# Patient Record
Sex: Male | Born: 1980 | Race: White | Hispanic: No | Marital: Married | Smoking: Current every day smoker
Health system: Southern US, Community
[De-identification: ages and names within clinical notes are randomized; demographics above are authoritative.]

## PROBLEM LIST (undated history)

## (undated) DIAGNOSIS — F411 Generalized anxiety disorder: Secondary | ICD-10-CM

## (undated) DIAGNOSIS — J40 Bronchitis, not specified as acute or chronic: Secondary | ICD-10-CM

## (undated) DIAGNOSIS — G250 Essential tremor: Secondary | ICD-10-CM

## (undated) HISTORY — DX: Generalized anxiety disorder: F41.1

## (undated) HISTORY — DX: Essential tremor: G25.0

---

## 2002-08-01 ENCOUNTER — Emergency Department (HOSPITAL_COMMUNITY): Admission: EM | Admit: 2002-08-01 | Discharge: 2002-08-01 | Payer: Self-pay | Admitting: Emergency Medicine

## 2002-08-01 ENCOUNTER — Encounter: Payer: Self-pay | Admitting: Oral Surgery

## 2002-08-26 ENCOUNTER — Emergency Department (HOSPITAL_COMMUNITY): Admission: EM | Admit: 2002-08-26 | Discharge: 2002-08-26 | Payer: Self-pay | Admitting: Emergency Medicine

## 2002-08-26 ENCOUNTER — Encounter: Payer: Self-pay | Admitting: Emergency Medicine

## 2010-12-12 ENCOUNTER — Emergency Department (HOSPITAL_COMMUNITY)
Admission: EM | Admit: 2010-12-12 | Discharge: 2010-12-12 | Disposition: A | Discharge status: Home / Self Care | Payer: Self-pay | Attending: Emergency Medicine | Admitting: Emergency Medicine

## 2010-12-12 DIAGNOSIS — B86 Scabies: Secondary | ICD-10-CM | POA: Insufficient documentation

## 2010-12-12 DIAGNOSIS — R21 Rash and other nonspecific skin eruption: Secondary | ICD-10-CM | POA: Insufficient documentation

## 2011-08-23 ENCOUNTER — Encounter (HOSPITAL_COMMUNITY): Payer: Self-pay | Admitting: *Deleted

## 2011-08-23 ENCOUNTER — Emergency Department (HOSPITAL_COMMUNITY): Payer: PRIVATE HEALTH INSURANCE

## 2011-08-23 ENCOUNTER — Emergency Department (HOSPITAL_COMMUNITY)
Admission: EM | Admit: 2011-08-23 | Discharge: 2011-08-23 | Disposition: A | Discharge status: Home / Self Care | Payer: PRIVATE HEALTH INSURANCE | Attending: Emergency Medicine | Admitting: Emergency Medicine

## 2011-08-23 DIAGNOSIS — M549 Dorsalgia, unspecified: Secondary | ICD-10-CM | POA: Insufficient documentation

## 2011-08-23 DIAGNOSIS — R071 Chest pain on breathing: Secondary | ICD-10-CM | POA: Insufficient documentation

## 2011-08-23 DIAGNOSIS — R0789 Other chest pain: Secondary | ICD-10-CM

## 2011-08-23 DIAGNOSIS — R0602 Shortness of breath: Secondary | ICD-10-CM | POA: Insufficient documentation

## 2011-08-23 DIAGNOSIS — F17200 Nicotine dependence, unspecified, uncomplicated: Secondary | ICD-10-CM | POA: Insufficient documentation

## 2011-08-23 LAB — POCT I-STAT, CHEM 8
BUN: 15 mg/dL (ref 6–23)
Creatinine, Ser: 1.1 mg/dL (ref 0.50–1.35)
Hemoglobin: 17.7 g/dL — ABNORMAL HIGH (ref 13.0–17.0)
Potassium: 3.9 mEq/L (ref 3.5–5.1)
Sodium: 141 mEq/L (ref 135–145)

## 2011-08-23 LAB — CBC
Hemoglobin: 17.5 g/dL — ABNORMAL HIGH (ref 13.0–17.0)
Platelets: 259 10*3/uL (ref 150–400)
RBC: 5.57 MIL/uL (ref 4.22–5.81)

## 2011-08-23 LAB — POCT I-STAT TROPONIN I: Troponin i, poc: 0 ng/mL (ref 0.00–0.08)

## 2011-08-23 MED ORDER — OXYCODONE-ACETAMINOPHEN 5-325 MG PO TABS: 1.0000 | ORAL_TABLET | ORAL | Status: AC | PRN

## 2011-08-23 MED ORDER — CYCLOBENZAPRINE HCL 10 MG PO TABS: 10.0000 mg | ORAL_TABLET | Freq: Two times a day (BID) | ORAL | Status: AC | PRN

## 2011-08-23 MED ORDER — KETOROLAC TROMETHAMINE 30 MG/ML IJ SOLN
30.0000 mg | Freq: Once | INTRAMUSCULAR | Status: AC
  Administered 2011-08-23: 30 mg via INTRAVENOUS
  Filled 2011-08-23: qty 1

## 2011-08-23 MED ORDER — IOHEXOL 300 MG/ML  SOLN
100.0000 mL | Freq: Once | INTRAMUSCULAR | Status: AC | PRN
  Administered 2011-08-23: 80 mL via INTRAVENOUS

## 2011-08-23 MED ORDER — MORPHINE SULFATE 4 MG/ML IJ SOLN
4.0000 mg | Freq: Once | INTRAMUSCULAR | Status: AC
  Administered 2011-08-23: 4 mg via INTRAVENOUS
  Filled 2011-08-23: qty 1

## 2011-08-23 MED ORDER — OXYCODONE-ACETAMINOPHEN 5-325 MG PO TABS: 2.0000 | ORAL_TABLET | ORAL | Status: DC | PRN

## 2011-08-23 NOTE — ED Provider Notes (Signed)
History     CSN: [id number]  Arrival date & time 08/23/11  1703   First MD Initiated Contact with Patient 08/23/11 1935      Chief Complaint  Patient presents with  . Shortness of Breath   previously healthy 31 year old male presents with persisting and worsening left posterior lung and rib pain. He states he was seen at the local walk-in clinic approximately 2 weeks ago and was initially diagnosed with "bronchitis and pneumonia and "he was placed on a Zithromax. Patient initially felt was getting better but then began having fairly severe left posterior rib pain and pain with deep inspiration. At that point in time. He was again seen at the [facility] clinic and was diagnosed with pleurisy. Place, and was placed on indomethacin. He has been on indomethacin for approximately 7 days. He now feels increasing pain in his left posterior back and lung, especially with deep inspiration. Denies any fevers. Denies any anterior chest pain. He has mild shortness of breath. No other complaints at this time. He currently smokes a half pack per day. According to the chart  (Consider location/radiation/quality/duration/timing/severity/associated sxs/prior treatment) HPI  History reviewed. No pertinent past medical history.  History reviewed. No pertinent past surgical history.  No family history on file.  History  Substance Use Topics  . Smoking status: Current Everyday Smoker -- 0.5 packs/day  . Smokeless tobacco: Not on file  . Alcohol Use: No      Review of Systems  All other systems reviewed and are negative.    Allergies  Review of patient's allergies indicates no known allergies.  Home Medications   Current Outpatient Rx  Name Route Sig Dispense Refill  . INDOMETHACIN 25 MG PO CAPS Oral Take 25 mg by mouth 3 (three) times daily with meals. Take one to two tablet by mouth.      BP 130/79  Pulse 87  Temp(Src) 97.8 F (36.6 C) (Oral)  Resp 19  Wt 170 lb (77.111 kg)  SpO2  99%  Physical Exam  Nursing note and vitals reviewed. Constitutional: He is oriented to person, place, and time. He appears well-developed and well-nourished.  HENT:  Head: Normocephalic and atraumatic.  Eyes: Conjunctivae and EOM are normal. Pupils are equal, round, and reactive to light.  Neck: Neck supple.  Cardiovascular: Normal rate and regular rhythm.  Exam reveals no gallop and no friction rub.   No murmur heard. Pulmonary/Chest: Breath sounds normal. No respiratory distress. He has no wheezes. He has no rales. He exhibits no tenderness.  Abdominal: Soft. Bowel sounds are normal. He exhibits no distension. There is no tenderness. There is no rebound and no guarding.  Musculoskeletal: Normal range of motion.  Neurological: He is alert and oriented to person, place, and time. No cranial nerve deficit. Coordination normal.  Skin: Skin is warm and dry. No rash noted.  Psychiatric: He has a normal mood and affect.    ED Course  Procedures (including critical care time)   Labs Reviewed  I-STAT TROPONIN I  I-STAT, CHEM 8  CBC   Dg Chest 2 View  08/23/2011  *RADIOLOGY REPORT*  Clinical Data: Shortness of breath.  CHEST - 2 VIEW  Comparison: Plain films of the chest 08/11/2011.  Findings: The lungs are clear.  No pneumothorax or pleural effusion.  Heart size is normal.  No focal bony abnormality.  IMPRESSION: Negative chest.  Original Report Authenticated By: Bernadene Bell. Maricela Curet, M.D.     No diagnosis found.    MDM  Pt is seen and examined;  Initial history and physical completed.  Will follow.      Results for orders placed during the hospital encounter of 08/23/11  CBC      Component Value Range   WBC 10.8 (*) 4.0 - 10.5 (K/uL)   RBC 5.57  4.22 - 5.81 (MIL/uL)   Hemoglobin 17.5 (*) 13.0 - 17.0 (g/dL)   HCT 16.1  09.6 - 04.5 (%)   MCV 88.0  78.0 - 100.0 (fL)   MCH 31.4  26.0 - 34.0 (pg)   MCHC 35.7  30.0 - 36.0 (g/dL)   RDW 40.9  81.1 - 91.4 (%)   Platelets 259   150 - 400 (K/uL)  POCT I-STAT, CHEM 8      Component Value Range   Sodium 141  135 - 145 (mEq/L)   Potassium 3.9  3.5 - 5.1 (mEq/L)   Chloride 105  96 - 112 (mEq/L)   BUN 15  6 - 23 (mg/dL)   Creatinine, Ser 7.82  0.50 - 1.35 (mg/dL)   Glucose, Bld 90  70 - 99 (mg/dL)   Calcium, Ion 9.56  2.13 - 1.32 (mmol/L)   TCO2 26  0 - 100 (mmol/L)   Hemoglobin 17.7 (*) 13.0 - 17.0 (g/dL)   HCT 08.6  57.8 - 46.9 (%)  POCT I-STAT TROPONIN I      Component Value Range   Troponin i, poc 0.00  0.00 - 0.08 (ng/mL)   Comment 3            Dg Chest 2 View  08/23/2011  *RADIOLOGY REPORT*  Clinical Data: Shortness of breath.  CHEST - 2 VIEW  Comparison: Plain films of the chest 08/11/2011.  Findings: The lungs are clear.  No pneumothorax or pleural effusion.  Heart size is normal.  No focal bony abnormality.  IMPRESSION: Negative chest.  Original Report Authenticated By: Bernadene Bell. Maricela Curet, M.D.   Ct Angio Chest W/cm &/or Wo Cm  08/23/2011  *RADIOLOGY REPORT*  Clinical Data:  Chest pain and shortness of breath.  CT ANGIOGRAPHY CHEST WITH CONTRAST  Technique:  Multidetector CT imaging of the chest was performed using the standard protocol during bolus administration of intravenous contrast.  Multiplanar CT image reconstructions including MIPs were obtained to evaluate the vascular anatomy.  Contrast: 80mL OMNIPAQUE IOHEXOL 300 MG/ML IV SOLN  Comparison:  Two-view chest 08/22/2010.  Findings:  Pulmonary arterial opacification is excellent.  No focal filling defects are present to suggest pulmonary emboli.  The heart size is normal.  No significant pleural or pericardial effusion is present.  There is no significant mediastinal or axillary adenopathy.  Minimal soft tissue in the breasts bilaterally suggest gynecomastia.  The lungs are clear.  No focal nodule, mass, or airspace disease is present.  Bone windows are unremarkable.  Review of the MIP images confirms the above findings.  IMPRESSION:  1.  No evidence for  pulmonary embolus. 2.  No acute or focal abnormality to explain the patient's symptoms. 3.  Minimal soft tissue within the breasts bilaterally suggesting gynecomastia.  Original Report Authenticated By: Jamesetta Orleans. MATTERN, M.D.      9:32 PM  Patient feels much better. Lab studies and CAT scan was normal. Suspect strain of the intercostal cartilages based on his clinical presentation. He'll be stable for discharge home on a course of pain medication and muscle relaxers and will be instructed to followup with his primary Dr. this week.  Tatiana Courter A. Patrica Duel, MD 08/23/11 2133

## 2011-08-23 NOTE — ED Notes (Signed)
Pt states "was seen @ [facility], given endomethacin, the pain has been going on for about a wk, but today I was up on an eave & felt a click in my back, that's when it hurt"; pt indicates left mid back pain.

## 2011-08-23 NOTE — ED Notes (Signed)
Pt reports being diagnosed with PNA approximately a month ago. Followed up with PCP a couple of weeks ago and was told that he had pleurisy. Pt continues to have chest wall pain and still has a strong cough. Pt concerned that he may have a broken rib.

## 2012-04-27 ENCOUNTER — Emergency Department (HOSPITAL_COMMUNITY)
Admission: EM | Admit: 2012-04-27 | Discharge: 2012-04-27 | Disposition: A | Discharge status: Home / Self Care | Payer: PRIVATE HEALTH INSURANCE | Attending: Emergency Medicine | Admitting: Emergency Medicine

## 2012-04-27 ENCOUNTER — Emergency Department (HOSPITAL_COMMUNITY): Payer: PRIVATE HEALTH INSURANCE

## 2012-04-27 ENCOUNTER — Encounter (HOSPITAL_COMMUNITY): Payer: Self-pay | Admitting: *Deleted

## 2012-04-27 DIAGNOSIS — F17200 Nicotine dependence, unspecified, uncomplicated: Secondary | ICD-10-CM | POA: Insufficient documentation

## 2012-04-27 DIAGNOSIS — K122 Cellulitis and abscess of mouth: Secondary | ICD-10-CM | POA: Insufficient documentation

## 2012-04-27 HISTORY — DX: Bronchitis, not specified as acute or chronic: J40

## 2012-04-27 LAB — CBC WITH DIFFERENTIAL/PLATELET
Basophils Absolute: 0 K/uL (ref 0.0–0.1)
Basophils Relative: 0 % (ref 0–1)
Eosinophils Absolute: 0.2 K/uL (ref 0.0–0.7)
Eosinophils Relative: 2 % (ref 0–5)
HCT: 43.7 % (ref 39.0–52.0)
Hemoglobin: 15.2 g/dL (ref 13.0–17.0)
Lymphocytes Relative: 28 % (ref 12–46)
Lymphs Abs: 2 K/uL (ref 0.7–4.0)
MCH: 30.8 pg (ref 26.0–34.0)
MCHC: 34.8 g/dL (ref 30.0–36.0)
MCV: 88.5 fL (ref 78.0–100.0)
Monocytes Absolute: 0.7 K/uL (ref 0.1–1.0)
Monocytes Relative: 10 % (ref 3–12)
Neutro Abs: 4.2 K/uL (ref 1.7–7.7)
Neutrophils Relative %: 59 % (ref 43–77)
Platelets: 179 K/uL (ref 150–400)
RBC: 4.94 MIL/uL (ref 4.22–5.81)
RDW: 12.5 % (ref 11.5–15.5)
WBC: 7.1 K/uL (ref 4.0–10.5)

## 2012-04-27 LAB — BASIC METABOLIC PANEL WITH GFR
BUN: 9 mg/dL (ref 6–23)
CO2: 28 meq/L (ref 19–32)
Calcium: 9.4 mg/dL (ref 8.4–10.5)
Chloride: 104 meq/L (ref 96–112)
Creatinine, Ser: 1.02 mg/dL (ref 0.50–1.35)
GFR calc Af Amer: >90 mL/min
GFR calc non Af Amer: >90 mL/min
Glucose, Bld: 85 mg/dL (ref 70–99)
Potassium: 4.2 meq/L (ref 3.5–5.1)
Sodium: 139 meq/L (ref 135–145)

## 2012-04-27 LAB — RAPID STREP SCREEN (MED CTR MEBANE ONLY): Streptococcus, Group A Screen (Direct): NEGATIVE

## 2012-04-27 LAB — MONONUCLEOSIS SCREEN: Mono Screen: NEGATIVE

## 2012-04-27 MED ORDER — PREDNISONE 10 MG PO TABS
40.0000 mg | ORAL_TABLET | Freq: Every day | ORAL | Status: AC
Start: 2012-04-27 — End: ?

## 2012-04-27 MED ORDER — AMOXICILLIN 500 MG PO CAPS
500.0000 mg | ORAL_CAPSULE | Freq: Once | ORAL | Status: AC
  Administered 2012-04-27: 500 mg via ORAL
  Filled 2012-04-27: qty 1

## 2012-04-27 MED ORDER — DEXAMETHASONE SODIUM PHOSPHATE 10 MG/ML IJ SOLN
10.0000 mg | Freq: Once | INTRAMUSCULAR | Status: AC
  Administered 2012-04-27: 10 mg via INTRAVENOUS
  Filled 2012-04-27: qty 1

## 2012-04-27 MED ORDER — MORPHINE SULFATE 4 MG/ML IJ SOLN
4.0000 mg | Freq: Once | INTRAMUSCULAR | Status: AC
  Administered 2012-04-27: 4 mg via INTRAVENOUS
  Filled 2012-04-27: qty 1

## 2012-04-27 MED ORDER — DIPHENHYDRAMINE HCL 50 MG/ML IJ SOLN
25.0000 mg | Freq: Once | INTRAMUSCULAR | Status: AC
  Administered 2012-04-27: 08:00:00 via INTRAVENOUS

## 2012-04-27 MED ORDER — AMOXICILLIN 500 MG PO CAPS: 500.0000 mg | ORAL_CAPSULE | Freq: Three times a day (TID) | ORAL | Status: AC

## 2012-04-27 MED ORDER — FAMOTIDINE IN NACL 20-0.9 MG/50ML-% IV SOLN
20.0000 mg | Freq: Once | INTRAVENOUS | Status: AC
  Administered 2012-04-27: 20 mg via INTRAVENOUS
  Filled 2012-04-27: qty 50

## 2012-04-27 MED ORDER — IOHEXOL 300 MG/ML  SOLN
75.0000 mL | Freq: Once | INTRAMUSCULAR | Status: AC | PRN
  Administered 2012-04-27: 75 mL via INTRAVENOUS

## 2012-04-27 NOTE — Progress Notes (Signed)
31 yo man woke up this morning with swelling of his uvula.  He was all right yesterday.  There was no fever.  He has no history of allergic reactions.  Exam shows redness and swelling of the uvula.  CBC, chemistries, rapid strep, mono spot all negative.  Soft tissue neck and CT neck showed patent airway, uvular swelling.  Rx for uvular infection with amoxicillin 500 mg tid x 5 days.  He can take Chloraseptic spray or Cepacol lozenges for symptomatic relief.

## 2012-04-27 NOTE — ED Notes (Signed)
The pt  Woke up with am with a sorethroat.  He felt ok yesterday.  His throat hurts to swallow

## 2012-04-27 NOTE — ED Notes (Signed)
Pt woke this morning with difficulty breathing, RA sats 97%, swollen uvula and tonsils.  Productive cough.

## 2012-04-27 NOTE — ED Provider Notes (Signed)
History     CSN: [id number]  Arrival date & time 04/27/12  4098   First MD Initiated Contact with Patient 04/27/12 [phone]      Chief Complaint  Patient presents with  . Shortness of Breath    (Consider location/radiation/quality/duration/timing/severity/associated sxs/prior treatment) HPI Comments: Mark Hoover is a 31 y.o. Male who presents with complaint of throat swelling. States his wife woke him up this morning because he was snoring. States when he woke up, he noted that his throat is very sore, and it felt like it was swollen. Pt states difficulty and pain with swallowing, voice change. Denies recent URI symptoms. Denies fever. Does admit to cough but states it has been going on for "months."  Denies any new products or medications. No swelling of lips, no rash. No other complaints. NO medications taken prior to the arrival.    Past Medical History  Diagnosis Date  . Bronchitis     History reviewed. No pertinent past surgical history.  History reviewed. No pertinent family history.  History  Substance Use Topics  . Smoking status: Current Every Day Smoker -- 0.5 packs/day    Types: Cigarettes  . Smokeless tobacco: Not on file  . Alcohol Use: Yes      Review of Systems  Constitutional: Negative for fever and chills.  HENT: Positive for sore throat, trouble swallowing and voice change. Negative for congestion, facial swelling, drooling, neck pain, neck stiffness and postnasal drip.   Respiratory: Positive for cough and shortness of breath.   Cardiovascular: Negative.   Gastrointestinal: Negative.   Musculoskeletal: Negative.   Skin: Negative for rash.  Neurological: Negative for dizziness, weakness and headaches.  Hematological: Negative for adenopathy.    Allergies  Review of patient's allergies indicates no known allergies.  Home Medications  No current outpatient prescriptions on file.  BP 135/88  Pulse 69  Temp 97.7 F (36.5 C) (Oral)  Resp 20  SpO2  97%  Physical Exam  Nursing note and vitals reviewed. Constitutional: He is oriented to person, place, and time. He appears well-developed and well-nourished. No distress.  HENT:  Head: Normocephalic and atraumatic.  Right Ear: Tympanic membrane, external ear and ear canal normal.  Left Ear: Tympanic membrane, external ear and ear canal normal.  Nose: Nose normal.  Mouth/Throat: Mucous membranes are normal. Uvula swelling present. Posterior oropharyngeal edema and posterior oropharyngeal erythema present. No oropharyngeal exudate.  Eyes: Conjunctivae normal are normal.  Neck: Normal range of motion. Neck supple.  Cardiovascular: Normal rate, regular rhythm and normal heart sounds.   Pulmonary/Chest: Effort normal and breath sounds normal. No respiratory distress. He has no wheezes. He has no rales.  Musculoskeletal: He exhibits no edema.  Lymphadenopathy:    He has no cervical adenopathy.  Neurological: He is alert and oriented to person, place, and time.  Skin: Skin is warm and dry.  Psychiatric: He has a normal mood and affect.    ED Course  Procedures (including critical care time)  Pt with swelling of uvula and pharynx, painful. Suspect infectious vs allergic. Will get labs, steroids, benadryl ordered, pain meds ordered.   Results for orders placed during the hospital encounter of 04/27/12  RAPID STREP SCREEN      Component Value Range   Streptococcus, Group A Screen (Direct) NEGATIVE  NEGATIVE  CBC WITH DIFFERENTIAL      Component Value Range   WBC 7.1  4.0 - 10.5 K/uL   RBC 4.94  4.22 - 5.81 MIL/uL   Hemoglobin  15.2  13.0 - 17.0 g/dL   HCT 96.2  95.2 - 84.1 %   MCV 88.5  78.0 - 100.0 fL   MCH 30.8  26.0 - 34.0 pg   MCHC 34.8  30.0 - 36.0 g/dL   RDW 32.4  40.1 - 02.7 %   Platelets 179  150 - 400 K/uL   Neutrophils Relative 59  43 - 77 %   Neutro Abs 4.2  1.7 - 7.7 K/uL   Lymphocytes Relative 28  12 - 46 %   Lymphs Abs 2.0  0.7 - 4.0 K/uL   Monocytes Relative 10  3  - 12 %   Monocytes Absolute 0.7  0.1 - 1.0 K/uL   Eosinophils Relative 2  0 - 5 %   Eosinophils Absolute 0.2  0.0 - 0.7 K/uL   Basophils Relative 0  0 - 1 %   Basophils Absolute 0.0  0.0 - 0.1 K/uL  BASIC METABOLIC PANEL      Component Value Range   Sodium 139  135 - 145 mEq/L   Potassium 4.2  3.5 - 5.1 mEq/L   Chloride 104  96 - 112 mEq/L   CO2 28  19 - 32 mEq/L   Glucose, Bld 85  70 - 99 mg/dL   BUN 9  6 - 23 mg/dL   Creatinine, Ser 2.53  0.50 - 1.35 mg/dL   Calcium 9.4  8.4 - 66.4 mg/dL   GFR calc non Af Amer >90  >90 mL/min   GFR calc Af Amer >90  >90 mL/min  MONONUCLEOSIS SCREEN      Component Value Range   Mono Screen NEGATIVE  NEGATIVE   Dg Neck Soft Tissue  04/27/2012  *RADIOLOGY REPORT*  Clinical Data: Sore throat.  Short of breath.  NECK SOFT TISSUES - 1+ VIEW  Comparison: None.  Findings: The nasopharynx and oropharynx are patent.  Upper trachea is widely patent without narrowing.  Epiglottis and aryepiglottic folds are unremarkable.  Minimal hypertrophy of the palatine tonsils.  IMPRESSION: Patent airway.  Minimal hypertrophy of the palatine tonsils.  CT may be helpful.   Original Report Authenticated By: Donavan Burnet, M.D.    Ct Soft Tissue Neck W Contrast  04/27/2012  *RADIOLOGY REPORT*  Clinical Data: 31 year old male with throat swelling, shortness of breath.  CT NECK WITH CONTRAST  Technique:  Multidetector CT imaging of the neck was performed with intravenous contrast.  Contrast: 75mL OMNIPAQUE IOHEXOL 300 MG/ML  SOLN  Comparison: lateral neck radiograph 0734 hours the same day.  Findings: Lung apices are clear.  Negative visualized superior mediastinum.  Negative thyroid and larynx.  Retropharyngeal space, parapharyngeal spaces, sublingual space, submandibular glands and parotid glands are within normal limits.  Bilateral cervical lymph nodes are within normal limits for age.  Mild adenoid hypertrophy is within normal limits and the posterior nasal cavity and  nasopharynx are normal.  The remainder of the nasal cavity is normal.  The hard palate is normal.  The oral cavity is normal.  Bilateral tonsillar pillar hypertrophy.  There is prominence of the soft palate in the region of the uvula which appears to be in part related to retained low density secretions. This does mildly narrow the airway at the level of the uvula.  The tonsillar hypertrophy is symmetric and abate inferiorly. There is mild lingual tonsil hypertrophy.  Trace retained secretions suspected in the vallecula.  Normal epiglottis and hypopharynx.  Major vascular structures in the neck are patent and normal. Visualized  orbit soft tissues are within normal limits.  Negative visualized brain parenchyma. Visualized paranasal sinuses and mastoids are clear.  No acute osseous abnormality identified.  IMPRESSION: 1.  There is soft tissue prominence of the soft palate including at the uvula, at least a portion of which appears to be low density and might relate to retained secretions. Other soft tissue enlargement/angio-edema type process is possible.  Recommend direct visualization if symptoms persist. Bilateral tonsillar pillar hypertrophy also affecting the oropharynx, but is symmetric and very likely physiologic. 2.  Otherwise normal nasopharynx, nasal cavity, oral cavity, hypopharynx and larynx. 3.    Otherwise normal neck CT.   Original Report Authenticated By: Harley Hallmark, M.D.     10:52 AM Labs normal. Uvula swelling improved slightly. Pain continues. Airway intact, swelling seems to subside slightly.  Discussed with Dr. Ignacia Palma, most likely infectious since very painful, will treat with amoxicillin for uvulitis, follow up with pcp. Return if worsening.    1. Uvulitis       MDM  Pt with painful swelling of the uvula and pharynx. VS normal. Labs unremarkable. Airway intact. CT showed edema, with patent airway, no abscess, no masses. Suspect infectious in etiology since very painful.  Antibiotics, steroids, home, return if worsening.         Lottie Mussel, PA 04/27/12 1058

## 2012-04-27 NOTE — ED Provider Notes (Signed)
Medical screening examination/treatment/procedure(s) were conducted as a shared visit with non-physician practitioner(s) and myself.  I personally evaluated the patient during the encounter 31 yo man woke up this morning with swelling of his uvula. He was all right yesterday. There was no fever. He has no history of allergic reactions. Exam shows redness and swelling of the uvula. CBC, chemistries, rapid strep, mono spot all negative. Soft tissue neck and CT neck showed patent airway, uvular swelling. Rx for uvular infection with amoxicillin 500 mg tid x 5 days. He can take Chloraseptic spray or Cepacol lozenges for symptomatic relief.       Carleene Cooper III, MD 04/27/12 [phone]

## 2012-12-13 ENCOUNTER — Ambulatory Visit (INDEPENDENT_AMBULATORY_CARE_PROVIDER_SITE_OTHER): Payer: PRIVATE HEALTH INSURANCE | Admitting: Neurology

## 2012-12-13 ENCOUNTER — Encounter: Payer: Self-pay | Admitting: Neurology

## 2012-12-13 VITALS — BP 115/81 | HR 77 | Temp 98.0°F | Ht 70.0 in | Wt 188.0 lb

## 2012-12-13 DIAGNOSIS — G250 Essential tremor: Secondary | ICD-10-CM

## 2012-12-13 DIAGNOSIS — F411 Generalized anxiety disorder: Secondary | ICD-10-CM | POA: Insufficient documentation

## 2012-12-13 HISTORY — DX: Essential tremor: G25.0

## 2012-12-13 HISTORY — DX: Generalized anxiety disorder: F41.1
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
## 2012-12-13 NOTE — Patient Instructions (Signed)
I think we should see how well you do on the Lamictal for reduction of anxiety and it may positively affect your tremor. Your exam otherwise is normal. Let's check again in 2 months.

## 2013-02-12 ENCOUNTER — Ambulatory Visit: Payer: PRIVATE HEALTH INSURANCE | Admitting: Neurology

## 2013-10-16 ENCOUNTER — Emergency Department (HOSPITAL_COMMUNITY)
Admission: EM | Admit: 2013-10-16 | Discharge: 2013-10-16 | Disposition: A | Discharge status: Home / Self Care | Payer: PRIVATE HEALTH INSURANCE | Source: Home / Self Care | Attending: Emergency Medicine | Admitting: Emergency Medicine

## 2013-10-16 ENCOUNTER — Encounter (HOSPITAL_COMMUNITY): Payer: Self-pay | Admitting: Emergency Medicine

## 2013-10-16 DIAGNOSIS — J329 Chronic sinusitis, unspecified: Secondary | ICD-10-CM

## 2013-10-16 MED ORDER — MINOCYCLINE HCL 100 MG PO CAPS: 100.0000 mg | ORAL_CAPSULE | Freq: Two times a day (BID) | ORAL | Status: AC

## 2013-10-16 NOTE — ED Notes (Signed)
C/o dark green nasal drainage x 1 1/2 weeks, cough, body aches, chills and sweats, and weakness. He tried cold and flu medicine without relief.

## 2013-10-16 NOTE — ED Provider Notes (Signed)
Medical screening examination/treatment/procedure(s) were performed by non-physician practitioner and as supervising physician I was immediately available for consultation/collaboration.  Sybella Harnish, M.D.  Amelia Burgard C Donnie Gedeon, MD 10/16/13 2208 

## 2013-10-16 NOTE — ED Provider Notes (Signed)
CSN: [id number]     Arrival date & time 10/16/13  1815 History   First MD Initiated Contact with Patient 10/16/13 1926     Chief Complaint  Patient presents with  . URI   (Consider location/radiation/quality/duration/timing/severity/associated sxs/prior Treatment) HPI Comments: 33 year old male presents for evaluation of 10 days of cough, nasal congestion, sore throat, body aches, subjective fever. All the symptoms have been getting gradually worse over the past 10 days. He has been taking over-the-counter medications without relief of his symptoms. He feels lightheaded when coughing only. No measured fever at home. No recent travel or sick contacts.  Patient is a 33 y.o. male presenting with URI.  URI Presenting symptoms: congestion, cough, fatigue, fever, rhinorrhea and sore throat   Presenting symptoms: no ear pain   Associated symptoms: headaches   Associated symptoms: no arthralgias, no myalgias and no neck pain     Past Medical History  Diagnosis Date  . Bronchitis   . Essential tremor 12/13/2012  . Anxiety state, unspecified 12/13/2012   History reviewed. No pertinent past surgical history. Family History  Problem Relation Age of Onset  . Adopted: Yes   History  Substance Use Topics  . Smoking status: Current Every Day Smoker -- 0.50 packs/day    Types: Cigarettes  . Smokeless tobacco: Not on file  . Alcohol Use: No    Review of Systems  Constitutional: Positive for fever, chills and fatigue.  HENT: Positive for congestion, postnasal drip, rhinorrhea, sinus pressure and sore throat. Negative for ear discharge and ear pain.   Eyes: Negative for visual disturbance.  Respiratory: Positive for cough. Negative for chest tightness and shortness of breath.   Cardiovascular: Negative for chest pain, palpitations and leg swelling.  Gastrointestinal: Negative for nausea, vomiting, abdominal pain, diarrhea and constipation.  Genitourinary: Negative for dysuria, urgency, frequency  and hematuria.  Musculoskeletal: Negative for arthralgias, myalgias, neck pain and neck stiffness.  Skin: Negative for rash.  Neurological: Positive for light-headedness and headaches. Negative for dizziness and weakness.    Allergies  Lexapro  Home Medications   Current Outpatient Rx  Name  Route  Sig  Dispense  Refill  . ALPRAZolam (XANAX) 0.5 MG tablet   Oral   Take 0.5 mg by mouth every morning.         Marland Kitchen NEXIUM 40 MG capsule      every morning.          Marland Kitchen QUEtiapine (SEROQUEL) 100 MG tablet   Oral   Take 100 mg by mouth at bedtime.         . ALPRAZolam (XANAX XR) 2 MG 24 hr tablet               . LamoTRIgine 50 MG TB24               . minocycline (MINOCIN) 100 MG capsule   Oral   Take 1 capsule (100 mg total) by mouth 2 (two) times daily.   20 capsule   0   . predniSONE (DELTASONE) 10 MG tablet   Oral   Take 4 tablets (40 mg total) by mouth daily.   16 tablet   0    BP 124/88  Pulse 74  Temp(Src) 98.6 F (37 C) (Oral)  Resp 16  SpO2 100% Physical Exam  Nursing note and vitals reviewed. Constitutional: He is oriented to person, place, and time. He appears well-developed and well-nourished. No distress.  HENT:  Head: Normocephalic and atraumatic.  Nose: Right sinus  exhibits maxillary sinus tenderness and frontal sinus tenderness. Left sinus exhibits maxillary sinus tenderness and frontal sinus tenderness.  Mouth/Throat: Uvula is midline and mucous membranes are normal. Posterior oropharyngeal erythema present. No oropharyngeal exudate.  Neck: Normal range of motion. Neck supple.  Cardiovascular: Normal rate, regular rhythm and normal heart sounds.  Exam reveals no gallop and no friction rub.   No murmur heard. Pulmonary/Chest: Effort normal and breath sounds normal. No respiratory distress. He has no wheezes. He has no rales.  Lymphadenopathy:    He has cervical adenopathy (tonsillar, superficial cervical).  Neurological: He is alert and  oriented to person, place, and time. Coordination normal.  Skin: Skin is warm and dry. No rash noted. He is not diaphoretic.  Psychiatric: He has a normal mood and affect. Judgment normal.    ED Course  Procedures (including critical care time) Labs Review Labs Reviewed - No data to display Imaging Review No results found.   MDM   1. Sinusitis    Probable sinusitis. Will treat with minocycline because this will also treat uncomplicated pneumonia or atypical pneumonia. Followup when necessary if not improving. Advised to take Advil allergy sinus as needed for sinus pressure and drainage, and Delsym as needed for cough.   New Prescriptions   MINOCYCLINE (MINOCIN) 100 MG CAPSULE    Take 1 capsule (100 mg total) by mouth 2 (two) times daily.       Mark GoodZachary H Jaramie Bastos, PA-C 10/16/13 2002

## 2013-10-16 NOTE — Discharge Instructions (Signed)

## 2015-10-07 ENCOUNTER — Emergency Department (HOSPITAL_COMMUNITY)
Admission: EM | Admit: 2015-10-07 | Discharge: 2015-10-07 | Disposition: A | Discharge status: Home / Self Care | Payer: PRIVATE HEALTH INSURANCE

## 2017-10-01 ENCOUNTER — Encounter: Payer: Self-pay | Admitting: Intensive Care

## 2017-10-01 ENCOUNTER — Emergency Department
Admission: EM | Admit: 2017-10-01 | Discharge: 2017-10-01 | Disposition: A | Discharge status: Home / Self Care | Payer: PRIVATE HEALTH INSURANCE | Attending: Emergency Medicine | Admitting: Emergency Medicine

## 2017-10-01 DIAGNOSIS — Z79899 Other long term (current) drug therapy: Secondary | ICD-10-CM | POA: Insufficient documentation

## 2017-10-01 DIAGNOSIS — F17210 Nicotine dependence, cigarettes, uncomplicated: Secondary | ICD-10-CM | POA: Insufficient documentation

## 2017-10-01 DIAGNOSIS — K529 Noninfective gastroenteritis and colitis, unspecified: Secondary | ICD-10-CM

## 2017-10-01 LAB — COMPREHENSIVE METABOLIC PANEL
ALK PHOS: 59 U/L (ref 38–126)
ALT: 56 U/L (ref 17–63)
ANION GAP: 14 (ref 5–15)
AST: 48 U/L — ABNORMAL HIGH (ref 15–41)
Albumin: 6 g/dL — ABNORMAL HIGH (ref 3.5–5.0)
BILIRUBIN TOTAL: 0.5 mg/dL (ref 0.3–1.2)
BUN: 20 mg/dL (ref 6–20)
CALCIUM: 10.2 mg/dL (ref 8.9–10.3)
CO2: 19 mmol/L — ABNORMAL LOW (ref 22–32)
Chloride: 104 mmol/L (ref 101–111)
Creatinine, Ser: 1.16 mg/dL (ref 0.61–1.24)
Glucose, Bld: 123 mg/dL — ABNORMAL HIGH (ref 65–99)
Potassium: 4.8 mmol/L (ref 3.5–5.1)
Sodium: 137 mmol/L (ref 135–145)
TOTAL PROTEIN: 10.4 g/dL — AB (ref 6.5–8.1)

## 2017-10-01 LAB — CBC
HCT: 53 % — ABNORMAL HIGH (ref 40.0–52.0)
Hemoglobin: 18.2 g/dL — ABNORMAL HIGH (ref 13.0–18.0)
MCH: 30 pg (ref 26.0–34.0)
MCHC: 34.3 g/dL (ref 32.0–36.0)
MCV: 87.4 fL (ref 80.0–100.0)
Platelets: 387 10*3/uL (ref 150–440)
RBC: 6.07 MIL/uL — AB (ref 4.40–5.90)
RDW: 13.5 % (ref 11.5–14.5)
WBC: 22.8 10*3/uL — AB (ref 3.8–10.6)

## 2017-10-01 LAB — LIPASE, BLOOD: Lipase: 61 U/L — ABNORMAL HIGH (ref 11–51)

## 2017-10-01 MED ORDER — SODIUM CHLORIDE 0.9 % IV SOLN
1000.0000 mL | Freq: Once | INTRAVENOUS | Status: AC
  Administered 2017-10-01: 1000 mL via INTRAVENOUS

## 2017-10-01 MED ORDER — ONDANSETRON 4 MG PO TBDP: 4.0000 mg | ORAL_TABLET | Freq: Three times a day (TID) | ORAL | 0 refills | Status: AC | PRN

## 2017-10-01 NOTE — ED Notes (Signed)
FIRST NURSE NOTE:  [place]  Pt states n/v/d since last night. IV in place.

## 2017-10-01 NOTE — ED Provider Notes (Signed)
[facility] Emergency Department Provider Note   ____________________________________________    I have reviewed the triage vital signs and the nursing notes.   HISTORY  Chief Complaint Emesis and Diarrhea     HPI Rodriguez Aguinaldo is a 37 y.o. male who presents with nausea vomiting and diarrhea.  Patient reports this morning he developed nausea and started vomiting multiple times.  He felt very weak so he called EMS.  He had 1-2 episodes of diarrhea.  No sick contacts reported.  He is not take anything for this.  He denies abdominal pain, but occasional cramps prior to vomiting.  No fevers or chills reported.  No recent travel or camping.   Past Medical History:  Diagnosis Date  . Anxiety state, unspecified 12/13/2012  . Bronchitis   . Essential tremor 12/13/2012    Patient Active Problem List   Diagnosis Date Noted  . Essential tremor 12/13/2012  . Anxiety state, unspecified 12/13/2012    History reviewed. No pertinent surgical history.  Prior to Admission medications   Medication Sig Start Date End Date Taking? Authorizing Provider  ALPRAZolam (XANAX XR) 2 MG 24 hr tablet  11/15/12   [provider]  ALPRAZolam Prudy Feeler) 0.5 MG tablet Take 0.5 mg by mouth every morning.    [provider]  LamoTRIgine 50 MG TB24  11/21/12   [provider]  minocycline (MINOCIN) 100 MG capsule Take 1 capsule (100 mg total) by mouth 2 (two) times daily. 10/16/13   Baker, Zachary H, PA-C  NEXIUM 40 MG capsule every morning.  11/20/12   [provider]  ondansetron (ZOFRAN ODT) 4 MG disintegrating tablet Take 1 tablet (4 mg total) by mouth every 8 (eight) hours as needed for nausea or vomiting. 10/01/17   Jene Every, MD  predniSONE (DELTASONE) 10 MG tablet Take 4 tablets (40 mg total) by mouth daily. 04/27/12   Kirichenko, Tatyana, PA-C  QUEtiapine (SEROQUEL) 100 MG tablet Take 100 mg by mouth at bedtime.    [provider]      Allergies Lexapro [escitalopram oxalate]  Family History  Adopted: Yes    Social History Social History   Tobacco Use  . Smoking status: Current Every Day Smoker    Packs/day: 0.50    Types: Cigarettes  . Smokeless tobacco: Never Used  Substance Use Topics  . Alcohol use: No    Frequency: Never  . Drug use: No    Review of Systems  Constitutional: No fever/chills Eyes: No visual changes.  ENT: No neck pain Cardiovascular: Denies palpitations Respiratory: Denies shortness of breath. Gastrointestinal: As above Genitourinary: Negative for dysuria. Musculoskeletal: Negative for back pain. Skin: Negative for rash. Neurological: Negative for headaches    ____________________________________________   PHYSICAL EXAM:  VITAL SIGNS: ED Triage Vitals  Enc Vitals Group     BP 10/01/17 1128 (!) 135/117     Pulse Rate 10/01/17 1128 82     Resp 10/01/17 1128 18     Temp 10/01/17 1128 97.6 F (36.4 C)     Temp Source 10/01/17 1128 Oral     SpO2 10/01/17 1128 98 %     Weight 10/01/17 1129 68 kg (150 lb)     Height 10/01/17 1129 1.778 m (5\' 10" )     Head Circumference --      Peak Flow --      Pain Score --      Pain Loc --      Pain Edu? --  Excl. in GC? --     Constitutional: Alert and oriented. No acute distress. Pleasant and interactive Eyes: Conjunctivae are normal.   Nose: No congestion/rhinnorhea. Mouth/Throat: Mucous membranes are moist.    Cardiovascular: Normal rate, regular rhythm.   Good peripheral circulation. Respiratory: Normal respiratory effort.  No retractions. Gastrointestinal: Soft and nontender. No distention.  No CVA tenderness. Genitourinary: deferred Musculoskeletal:   Warm and well perfused Neurologic:  Normal speech and language. No gross focal neurologic deficits are appreciated.  Skin:  Skin is warm, dry and intact. No rash noted. Psychiatric: Mood and affect are normal. Speech and behavior are  normal.  ____________________________________________   LABS (all labs ordered are listed, but only abnormal results are displayed)  Labs Reviewed  LIPASE, BLOOD - Abnormal; Notable for the following components:      Result Value   Lipase 61 (*)    All other components within normal limits  COMPREHENSIVE METABOLIC PANEL - Abnormal; Notable for the following components:   CO2 19 (*)    Glucose, Bld 123 (*)    Total Protein 10.4 (*)    Albumin 6.0 (*)    AST 48 (*)    All other components within normal limits  CBC - Abnormal; Notable for the following components:   WBC 22.8 (*)    RBC 6.07 (*)    Hemoglobin 18.2 (*)    HCT 53.0 (*)    All other components within normal limits   ____________________________________________  EKG  None ____________________________________________  RADIOLOGY  None ____________________________________________   PROCEDURES  Procedure(s) performed: No  Procedures   Critical Care performed: No ____________________________________________   INITIAL IMPRESSION / ASSESSMENT AND PLAN / ED COURSE  Pertinent labs & imaging results that were available during my care of the patient were reviewed by me and considered in my medical decision making (see chart for details).  Patient feeling somewhat better in the emergency department after nausea medication per EMS.  Abdominal exam is reassuring.  Elevated white blood cell count likely related to gastroenteritis.  No epigastric tenderness to suggest pancreatitis.  Patient will be treated with IV fluids and IV Zofran  Patient felt significant better after IV fluids and Zofran.  No further vomiting.  Will treat as an outpatient with p.o. Zofran, outpatient follow-up as needed.  Return precautions discussed    ____________________________________________   FINAL CLINICAL IMPRESSION(S) / ED DIAGNOSES  Final diagnoses:  Gastroenteritis        Note:  This document was prepared using  Dragon voice recognition software and may include unintentional dictation errors.    Jene EveryKinner, Nichola Cieslinski, MD 10/01/17 [phone]

## 2017-10-01 NOTE — ED Notes (Signed)
Paper copy of discharge signature in paper chart. Signature pad broken. Pt able to ambulate independently and in NAD at this time.

## 2017-10-01 NOTE — ED Triage Notes (Signed)
Patient reports N/V that started this AM around 0100. C/o diarrhea since arriving to ER with 1 episode. Arrived by EMS and they administered nausea medicine in route. A&O x4

## 2019-04-15 ENCOUNTER — Inpatient Hospital Stay (HOSPITAL_COMMUNITY): Payer: Medicaid Other

## 2019-04-15 ENCOUNTER — Emergency Department (HOSPITAL_COMMUNITY): Payer: Medicaid Other

## 2019-04-15 ENCOUNTER — Inpatient Hospital Stay (HOSPITAL_COMMUNITY)
Admission: EM | Admit: 2019-04-15 | Discharge: 2019-05-15 | DRG: 082 | Disposition: E | Payer: Medicaid Other | Attending: General Surgery | Admitting: General Surgery

## 2019-04-15 DIAGNOSIS — R402212 Coma scale, best verbal response, none, at arrival to emergency department: Secondary | ICD-10-CM | POA: Diagnosis present

## 2019-04-15 DIAGNOSIS — Z23 Encounter for immunization: Secondary | ICD-10-CM | POA: Diagnosis not present

## 2019-04-15 DIAGNOSIS — X749XXA Intentional self-harm by unspecified firearm discharge, initial encounter: Secondary | ICD-10-CM | POA: Diagnosis not present

## 2019-04-15 DIAGNOSIS — S0219XA Other fracture of base of skull, initial encounter for closed fracture: Secondary | ICD-10-CM | POA: Diagnosis present

## 2019-04-15 DIAGNOSIS — R402112 Coma scale, eyes open, never, at arrival to emergency department: Secondary | ICD-10-CM | POA: Diagnosis present

## 2019-04-15 DIAGNOSIS — S066X9A Traumatic subarachnoid hemorrhage with loss of consciousness of unspecified duration, initial encounter: Secondary | ICD-10-CM | POA: Diagnosis present

## 2019-04-15 DIAGNOSIS — J9601 Acute respiratory failure with hypoxia: Secondary | ICD-10-CM | POA: Diagnosis present

## 2019-04-15 DIAGNOSIS — S0231XB Fracture of orbital floor, right side, initial encounter for open fracture: Secondary | ICD-10-CM | POA: Diagnosis present

## 2019-04-15 DIAGNOSIS — S0193XA Puncture wound without foreign body of unspecified part of head, initial encounter: Secondary | ICD-10-CM | POA: Diagnosis present

## 2019-04-15 DIAGNOSIS — S0240DA Maxillary fracture, left side, initial encounter for closed fracture: Secondary | ICD-10-CM | POA: Diagnosis present

## 2019-04-15 DIAGNOSIS — S0240CA Maxillary fracture, right side, initial encounter for closed fracture: Secondary | ICD-10-CM | POA: Diagnosis present

## 2019-04-15 DIAGNOSIS — R402312 Coma scale, best motor response, none, at arrival to emergency department: Secondary | ICD-10-CM | POA: Diagnosis present

## 2019-04-15 DIAGNOSIS — S0232XB Fracture of orbital floor, left side, initial encounter for open fracture: Secondary | ICD-10-CM | POA: Diagnosis present

## 2019-04-15 DIAGNOSIS — Z20828 Contact with and (suspected) exposure to other viral communicable diseases: Secondary | ICD-10-CM | POA: Diagnosis present

## 2019-04-15 DIAGNOSIS — I959 Hypotension, unspecified: Secondary | ICD-10-CM | POA: Diagnosis present

## 2019-04-15 DIAGNOSIS — W3400XA Accidental discharge from unspecified firearms or gun, initial encounter: Secondary | ICD-10-CM

## 2019-04-15 DIAGNOSIS — Z452 Encounter for adjustment and management of vascular access device: Secondary | ICD-10-CM

## 2019-04-15 LAB — SARS CORONAVIRUS 2 BY RT PCR (HOSPITAL ORDER, PERFORMED IN ~~LOC~~ HOSPITAL LAB): SARS Coronavirus 2: NEGATIVE

## 2019-04-15 LAB — CBC
HCT: 47.3 % (ref 39.0–52.0)
Hemoglobin: 14.4 g/dL (ref 13.0–17.0)
MCH: 29.9 pg (ref 26.0–34.0)
MCHC: 30.4 g/dL (ref 30.0–36.0)
MCV: 98.3 fL (ref 80.0–100.0)
Platelets: 173 10*3/uL (ref 150–400)
RBC: 4.81 MIL/uL (ref 4.22–5.81)
RDW: 12 % (ref 11.5–15.5)
WBC: 6.6 10*3/uL (ref 4.0–10.5)
nRBC: 0 % (ref 0.0–0.2)

## 2019-04-15 LAB — I-STAT CHEM 8, ED
BUN: 13 mg/dL (ref 6–20)
Calcium, Ion: 0.9 mmol/L — ABNORMAL LOW (ref 1.15–1.40)
Chloride: 114 mmol/L — ABNORMAL HIGH (ref 98–111)
Creatinine, Ser: 1 mg/dL (ref 0.61–1.24)
Glucose, Bld: 372 mg/dL — ABNORMAL HIGH (ref 70–99)
HCT: 42 % (ref 39.0–52.0)
Hemoglobin: 14.3 g/dL (ref 13.0–17.0)
Potassium: 7.5 mmol/L (ref 3.5–5.1)
Sodium: 141 mmol/L (ref 135–145)
TCO2: 12 mmol/L — ABNORMAL LOW (ref 22–32)

## 2019-04-15 LAB — COMPREHENSIVE METABOLIC PANEL
ALT: 219 U/L — ABNORMAL HIGH (ref 0–44)
AST: 189 U/L — ABNORMAL HIGH (ref 15–41)
Albumin: 3.2 g/dL — ABNORMAL LOW (ref 3.5–5.0)
Alkaline Phosphatase: 38 U/L (ref 38–126)
Anion gap: 22 — ABNORMAL HIGH (ref 5–15)
BUN: 12 mg/dL (ref 6–20)
CO2: 10 mmol/L — ABNORMAL LOW (ref 22–32)
Calcium: 8.3 mg/dL — ABNORMAL LOW (ref 8.9–10.3)
Chloride: 110 mmol/L (ref 98–111)
Creatinine, Ser: 1.27 mg/dL — ABNORMAL HIGH (ref 0.61–1.24)
GFR calc Af Amer: >60 mL/min (ref >60)
GFR calc non Af Amer: >60 mL/min (ref >60)
Glucose, Bld: 343 mg/dL — ABNORMAL HIGH (ref 70–99)
Potassium: 7.3 mmol/L (ref 3.5–5.1)
Sodium: 142 mmol/L (ref 135–145)
Total Bilirubin: 0.9 mg/dL (ref 0.3–1.2)
Total Protein: 5.3 g/dL — ABNORMAL LOW (ref 6.5–8.1)

## 2019-04-15 LAB — PROTIME-INR
INR: 1.4 — ABNORMAL HIGH (ref 0.8–1.2)
Prothrombin Time: 17.1 seconds — ABNORMAL HIGH (ref 11.4–15.2)

## 2019-04-15 LAB — CDS SEROLOGY

## 2019-04-15 LAB — ETHANOL: Alcohol, Ethyl (B): <10 mg/dL (ref <10)

## 2019-04-15 LAB — ABO/RH: ABO/RH(D): AB POS

## 2019-04-15 LAB — PREPARE RBC (CROSSMATCH)

## 2019-04-15 LAB — LACTIC ACID, PLASMA: Lactic Acid, Venous: >11 mmol/L (ref 0.5–1.9)

## 2019-04-15 MED ORDER — NOREPINEPHRINE 16 MG/250ML-% IV SOLN
0.0000 ug/min | INTRAVENOUS | Status: DC
  Filled 2019-04-15: qty 250

## 2019-04-15 MED ORDER — FENTANYL BOLUS VIA INFUSION
50.0000 ug | INTRAVENOUS | Status: DC | PRN
  Filled 2019-04-15: qty 50

## 2019-04-15 MED ORDER — SODIUM CHLORIDE 0.9% IV SOLUTION: Freq: Once | INTRAVENOUS | Status: DC

## 2019-04-15 MED ORDER — SODIUM CHLORIDE 0.9 % IV BOLUS: 1000.0000 mL | Freq: Once | INTRAVENOUS | Status: DC

## 2019-04-15 MED ORDER — HYDRALAZINE HCL 20 MG/ML IJ SOLN: 10.0000 mg | INTRAMUSCULAR | Status: DC | PRN

## 2019-04-15 MED ORDER — PANTOPRAZOLE SODIUM 40 MG IV SOLR: 40.0000 mg | Freq: Every day | INTRAVENOUS | Status: DC

## 2019-04-15 MED ORDER — SODIUM CHLORIDE 0.9 % IV SOLN: INTRAVENOUS | Status: DC

## 2019-04-15 MED ORDER — PHENYLEPHRINE HCL-NACL 10-0.9 MG/250ML-% IV SOLN
0.0000 ug/min | INTRAVENOUS | Status: DC
  Administered 2019-04-15: 17:00:00 20 ug/min via INTRAVENOUS
  Administered 2019-04-15: 13.333 ug/min via INTRAVENOUS
  Filled 2019-04-15: qty 500
  Filled 2019-04-15: qty 250
  Filled 2019-04-15: qty 500

## 2019-04-15 MED ORDER — ATROPINE SULFATE 1 MG/ML IJ SOLN
INTRAMUSCULAR | Status: AC | PRN
  Administered 2019-04-15: 1 mg via INTRAVENOUS

## 2019-04-15 MED ORDER — CALCIUM GLUCONATE-NACL 2-0.675 GM/100ML-% IV SOLN
2.0000 g | Freq: Once | INTRAVENOUS | Status: AC
  Administered 2019-04-15: 17:00:00 2000 mg via INTRAVENOUS
  Filled 2019-04-15: qty 100

## 2019-04-15 MED ORDER — SODIUM BICARBONATE 8.4 % IV SOLN
50.0000 meq | Freq: Once | INTRAVENOUS | Status: AC
  Administered 2019-04-15: 50 meq via INTRAVENOUS

## 2019-04-15 MED ORDER — SODIUM CHLORIDE 0.9% IV SOLUTION
Freq: Once | INTRAVENOUS | Status: AC
  Administered 2019-04-15: 17:00:00 via INTRAVENOUS

## 2019-04-15 MED ORDER — SODIUM CHLORIDE 0.9 % IV SOLN
INTRAVENOUS | Status: AC | PRN
  Administered 2019-04-15: 999 mL/h via INTRAVENOUS

## 2019-04-15 MED ORDER — CHLORHEXIDINE GLUCONATE CLOTH 2 % EX PADS: 6.0000 | MEDICATED_PAD | Freq: Every day | CUTANEOUS | Status: DC

## 2019-04-15 MED ORDER — PHENYLEPHRINE HCL-NACL 40-0.9 MG/250ML-% IV SOLN
0.0000 ug/min | INTRAVENOUS | Status: DC
  Administered 2019-04-15: 20:00:00 400 ug/min via INTRAVENOUS
  Filled 2019-04-15 (×5): qty 250

## 2019-04-15 MED ORDER — TETANUS-DIPHTH-ACELL PERTUSSIS 5-2.5-18.5 LF-MCG/0.5 IM SUSP
0.5000 mL | Freq: Once | INTRAMUSCULAR | Status: AC
  Administered 2019-04-15: 0.5 mL via INTRAMUSCULAR

## 2019-04-15 MED ORDER — EPINEPHRINE 1 MG/10ML IJ SOSY
PREFILLED_SYRINGE | INTRAMUSCULAR | Status: AC | PRN
  Administered 2019-04-15: 1 via INTRAVENOUS

## 2019-04-15 MED ORDER — PROPOFOL 1000 MG/100ML IV EMUL
INTRAVENOUS | Status: AC
  Filled 2019-04-15: qty 100

## 2019-04-15 MED ORDER — PROPOFOL 1000 MG/100ML IV EMUL: 5.0000 ug/kg/min | INTRAVENOUS | Status: DC

## 2019-04-15 MED ORDER — ONDANSETRON 4 MG PO TBDP: 4.0000 mg | ORAL_TABLET | Freq: Four times a day (QID) | ORAL | Status: DC | PRN

## 2019-04-15 MED ORDER — BISACODYL 10 MG RE SUPP: 10.0000 mg | Freq: Every day | RECTAL | Status: DC | PRN

## 2019-04-15 MED ORDER — FENTANYL 2500MCG IN NS 250ML (10MCG/ML) PREMIX INFUSION: 50.0000 ug/h | INTRAVENOUS | Status: DC

## 2019-04-15 MED ORDER — FENTANYL CITRATE (PF) 100 MCG/2ML IJ SOLN: 50.0000 ug | Freq: Once | INTRAMUSCULAR | Status: DC

## 2019-04-15 MED ORDER — MIDAZOLAM HCL 2 MG/2ML IJ SOLN: 2.0000 mg | INTRAMUSCULAR | Status: DC | PRN

## 2019-04-15 MED ORDER — IOHEXOL 350 MG/ML SOLN
75.0000 mL | Freq: Once | INTRAVENOUS | Status: AC | PRN
  Administered 2019-04-15: 75 mL via INTRAVENOUS

## 2019-04-15 MED ORDER — NOREPINEPHRINE 4 MG/250ML-% IV SOLN: 0.0000 ug/min | INTRAVENOUS | Status: DC

## 2019-04-15 MED ORDER — PANTOPRAZOLE SODIUM 40 MG PO TBEC: 40.0000 mg | DELAYED_RELEASE_TABLET | Freq: Every day | ORAL | Status: DC

## 2019-04-15 MED ORDER — VASOPRESSIN 20 UNIT/ML IV SOLN
0.0300 [IU]/min | INTRAVENOUS | Status: DC
  Administered 2019-04-15: 0.03 [IU]/min via INTRAVENOUS
  Filled 2019-04-15: qty 2

## 2019-04-15 MED ORDER — PROPOFOL 1000 MG/100ML IV EMUL: 0.0000 ug/kg/min | INTRAVENOUS | Status: DC

## 2019-04-15 MED ORDER — NOREPINEPHRINE 4 MG/250ML-% IV SOLN
INTRAVENOUS | Status: AC
  Filled 2019-04-15: qty 250

## 2019-04-15 MED ORDER — ONDANSETRON HCL 4 MG/2ML IJ SOLN: 4.0000 mg | Freq: Four times a day (QID) | INTRAMUSCULAR | Status: DC | PRN

## 2019-04-15 MED ORDER — SODIUM BICARBONATE 8.4 % IV SOLN
INTRAVENOUS | Status: AC
  Filled 2019-04-15: qty 50

## 2019-04-16 LAB — BPAM RBC
Blood Product Expiration Date: 202009162359
Blood Product Expiration Date: 202009162359
Blood Product Expiration Date: 202009232359
Blood Product Expiration Date: 202009232359
Blood Product Expiration Date: 202009232359
Blood Product Expiration Date: 202009232359
Blood Product Expiration Date: 202009232359
Blood Product Expiration Date: 202009242359
Blood Product Expiration Date: 202010062359
Blood Product Expiration Date: 202010062359
ISSUE DATE / TIME: 202009011500
ISSUE DATE / TIME: 202009011500
ISSUE DATE / TIME: 202009011638
ISSUE DATE / TIME: 202009011658
ISSUE DATE / TIME: 202009011658
ISSUE DATE / TIME: 202009011742
ISSUE DATE / TIME: 202009011853
ISSUE DATE / TIME: 202009011853
ISSUE DATE / TIME: 202009011914
ISSUE DATE / TIME: 202009011914
Unit Type and Rh: 5100
Unit Type and Rh: 5100
Unit Type and Rh: 6200
Unit Type and Rh: 6200
Unit Type and Rh: 6200
Unit Type and Rh: 6200
Unit Type and Rh: 6200
Unit Type and Rh: 6200
Unit Type and Rh: 8400
Unit Type and Rh: 8400

## 2019-04-16 LAB — PREPARE FRESH FROZEN PLASMA
Unit division: 0
Unit division: 0
Unit division: 0

## 2019-04-16 LAB — TYPE AND SCREEN
ABO/RH(D): AB POS
Antibody Screen: NEGATIVE
Unit division: 0
Unit division: 0
Unit division: 0
Unit division: 0
Unit division: 0
Unit division: 0
Unit division: 0
Unit division: 0
Unit division: 0
Unit division: 0

## 2019-04-16 LAB — BPAM FFP
Blood Product Expiration Date: 202009172359
Blood Product Expiration Date: 202009172359
Blood Product Expiration Date: 202009062359
Blood Product Expiration Date: 202009062359
Blood Product Expiration Date: 202009062359
Blood Product Expiration Date: 202009062359
ISSUE DATE / TIME: 202009011500
ISSUE DATE / TIME: 202009011500
ISSUE DATE / TIME: 202009011701
ISSUE DATE / TIME: 202009011742
ISSUE DATE / TIME: 202009011941
ISSUE DATE / TIME: 202009011941
Unit Type and Rh: 6200
Unit Type and Rh: 6200
Unit Type and Rh: 8400
Unit Type and Rh: 8400
Unit Type and Rh: 8400
Unit Type and Rh: 8400

## 2019-04-16 MED FILL — Medication: Qty: 1 | Status: AC

## 2019-04-17 LAB — POCT I-STAT, CHEM 8
BUN: 13 mg/dL (ref 6–20)
Calcium, Ion: 0.9 mmol/L — ABNORMAL LOW (ref 1.15–1.40)
Chloride: 114 mmol/L — ABNORMAL HIGH (ref 98–111)
Creatinine, Ser: 1 mg/dL (ref 0.61–1.24)
Glucose, Bld: 372 mg/dL — ABNORMAL HIGH (ref 70–99)
HCT: 42 % (ref 39.0–52.0)
Hemoglobin: 14.3 g/dL (ref 13.0–17.0)
Potassium: 7.5 mmol/L (ref 3.5–5.1)
Sodium: 141 mmol/L (ref 135–145)
TCO2: 12 mmol/L — ABNORMAL LOW (ref 22–32)

## 2019-05-01 LAB — BLOOD GAS, ARTERIAL
Acid-base deficit: 12.5 mmol/L — ABNORMAL HIGH (ref 0.0–2.0)
Bicarbonate: 14.7 mmol/L — ABNORMAL LOW (ref 20.0–28.0)
Drawn by: 270221
FIO2: 100
MECHVT: 550 mL
O2 Saturation: 99.3 %
PEEP: 8 cmH2O
Patient temperature: 98.6
RATE: 24 resp/min
pCO2 arterial: 44 mmHg (ref 32.0–48.0)
pH, Arterial: 7.151 — CL (ref 7.350–7.450)
pO2, Arterial: 296 mmHg — ABNORMAL HIGH (ref 83.0–108.0)

## 2019-05-15 NOTE — Procedures (Signed)
Central Venous Catheter Insertion Procedure Note Mark Hoover [id number] 1981/07/15  Procedure: Insertion of Central Venous Catheter Indications: Drug and/or fluid administration  Procedure Details Consent: emergency Time Out: Verified patient identification, verified procedure, site/side was marked, verified correct patient position, special equipment/implants available, medications/allergies/relevent history reviewed, required imaging and test results available.  Performed  Maximum sterile technique was used including antiseptics, cap, gloves, gown, hand hygiene, mask and sheet. Skin prep: Chlorhexidine; local anesthetic administered A antimicrobial bonded/coated triple lumen catheter was placed in the left subclavian vein using the Seldinger technique.  Evaluation Blood flow good Complications: No apparent complications Patient did tolerate procedure well. Chest X-ray ordered to verify placement.  CXR: pending.  Mark Hoover May 06, 2019, 5:55 PM

## 2019-05-15 NOTE — Progress Notes (Signed)
Alger Memos, RN notes absence of cardiac sounds, verified by Martinique Allen, RN- Time of Death 12/23/2100.  MD present.

## 2019-05-15 NOTE — ED Notes (Signed)
C-collar applied

## 2019-05-15 NOTE — Progress Notes (Addendum)
Patient progressively hypotensive despite ongoing transfusion of blood/ FFP and maximized levo/vaso/neo. Wife present at the bedside and we discussed his dire prognosis and code status. 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 She expressed desire for full code measures as she was not prepared to make this decision.  PEA arrest and underwent CPR with chest compressions, epi x 3, and shock x 4 over the course of 10 minutes without return of spontaneous circulation. Time of death 20:50. His wife remained on the unit throughout this.

## 2019-05-15 NOTE — Progress Notes (Addendum)
Pt increasingly hypotensive, ongoing blood loss from nares/mouth. CXR with [place] cvl in good position/ no ptx. Now on neo, levo, vaso. abg 7.15, co2 44, base deficit 12.5. other labs pending.  crystalloid bolus and 1 amp bicarb given and transfusions going. Other labs pending. Continue resus, I discussed with Dr. Estanislado Pandy by phone; at this point no specific target for angio noted on CTA read in addition to which I do not think he is stable enough to go down. Most importantly given grim prognosis and likely non-survivable brain injury this will not change his outcome.   I updated his wife at the bedside

## 2019-05-15 NOTE — ED Notes (Signed)
Shoes, t shirt, shorts, and black underwear given to [employer] in brown paper bag.
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
## 2019-05-15 NOTE — ED Notes (Signed)
No pulses, CPR initiated.

## 2019-05-15 NOTE — Progress Notes (Signed)
   2019-05-09 1800  Clinical Encounter Type  Visited With Family;Patient  Visit Type Initial;Spiritual support;Social support  Referral From Chaplain  Consult/Referral To Chaplain  Spiritual Encounters  Spiritual Needs Emotional;Other (Comment)  Stress Factors  Family Stress Factors Major life changes;Health changes   Responded to relieve Chaplain for spiritual care for Wife of pt. [place] decided to go down to lower [place] tower to update the rest of family who was in front entrance. She came back up to be on stand by to be able to see husband.  I mentioned to [place] that I will be available for support if she needs me.   I was paged an hour later to help escort her to see her husband. I offered [place] spiritual support with ministry of presence, and empathic listening. She mentioned that they have been married around 11 years. Also she said that she was thankful for the care. I mentioned that we are available upon request.  Colonel Bald  [phone]

## 2019-05-15 NOTE — Progress Notes (Signed)
Emergent rapid  transfusion of blood products:  Unit # : [id number]  1 Unit # : [id number]  C Unit # :  [id number]  O Unit # :  [id number]  S Unit # :  [id number]  X Unit # : [id number]  Q
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
## 2019-05-15 NOTE — Procedures (Signed)
Arterial Catheter Insertion Procedure Note Mark Hoover [id number] [birth year]  Procedure: Insertion of Arterial Catheter  Indications: Blood pressure monitoring  Procedure Details Consent: emergency Time Out: Verified patient identification, verified procedure, site/side was marked, verified correct patient position, special equipment/implants available, medications/allergies/relevent history reviewed, required imaging and test results available.  Performed  Maximum sterile technique was used including antiseptics, cap, gloves, gown, hand hygiene, mask and sheet. Skin prep: Chlorhexidine; local anesthetic administered 20 gauge catheter was inserted into right femoral artery using the Seldinger technique. ULTRASOUND GUIDANCE USED: YES Evaluation Blood flow good; BP tracing good. Complications: No apparent complications.   Mark Hoover 2019-05-13

## 2019-05-15 DEATH — deceased

## 2019-06-15 NOTE — Progress Notes (Signed)
Entered chart for [employer] QI data 
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
